# Patient Record
Sex: Male | Born: 1990 | Race: White | Hispanic: No | Marital: Married
Health system: Southern US, Community
[De-identification: ages and names within clinical notes are randomized; demographics above are authoritative.]

## PROBLEM LIST (undated history)

## (undated) DIAGNOSIS — F1910 Other psychoactive substance abuse, uncomplicated: Secondary | ICD-10-CM

---

## 2013-03-12 ENCOUNTER — Ambulatory Visit
Admission: RE | Admit: 2013-03-12 | Discharge: 2013-03-12 | Disposition: A | Discharge status: Home / Self Care | Payer: No Typology Code available for payment source | Source: Ambulatory Visit | Attending: General Practice | Admitting: General Practice

## 2013-03-12 ENCOUNTER — Other Ambulatory Visit: Payer: Self-pay | Admitting: General Practice

## 2013-03-12 DIAGNOSIS — R7611 Nonspecific reaction to tuberculin skin test without active tuberculosis: Secondary | ICD-10-CM

## 2015-03-18 IMAGING — CR DG CHEST 2V
2 series · 2 of 2 positions shown · non-contrast
Comparison: None.

CLINICAL DATA: 21-year-old male with positive PPD.

EXAM:
CHEST  2 VIEW

[view not recorded (1 of 2)]
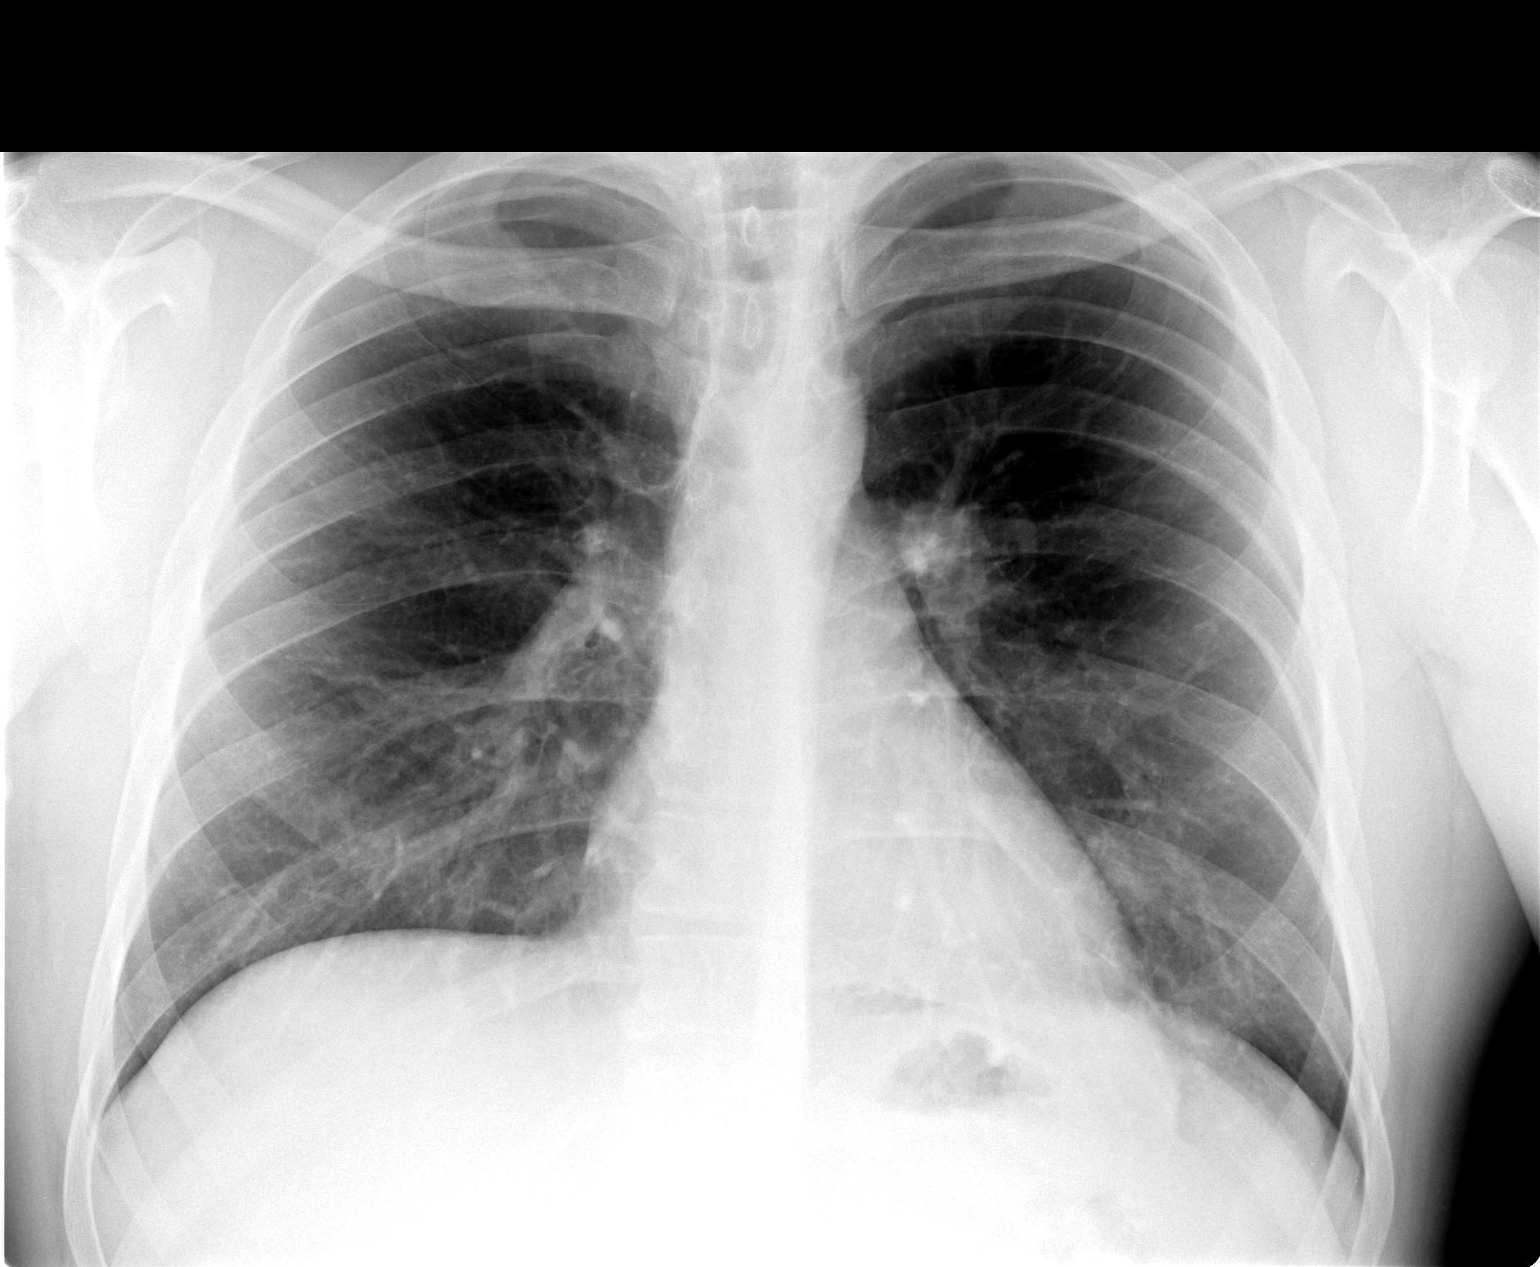

[view not recorded (2 of 2)]
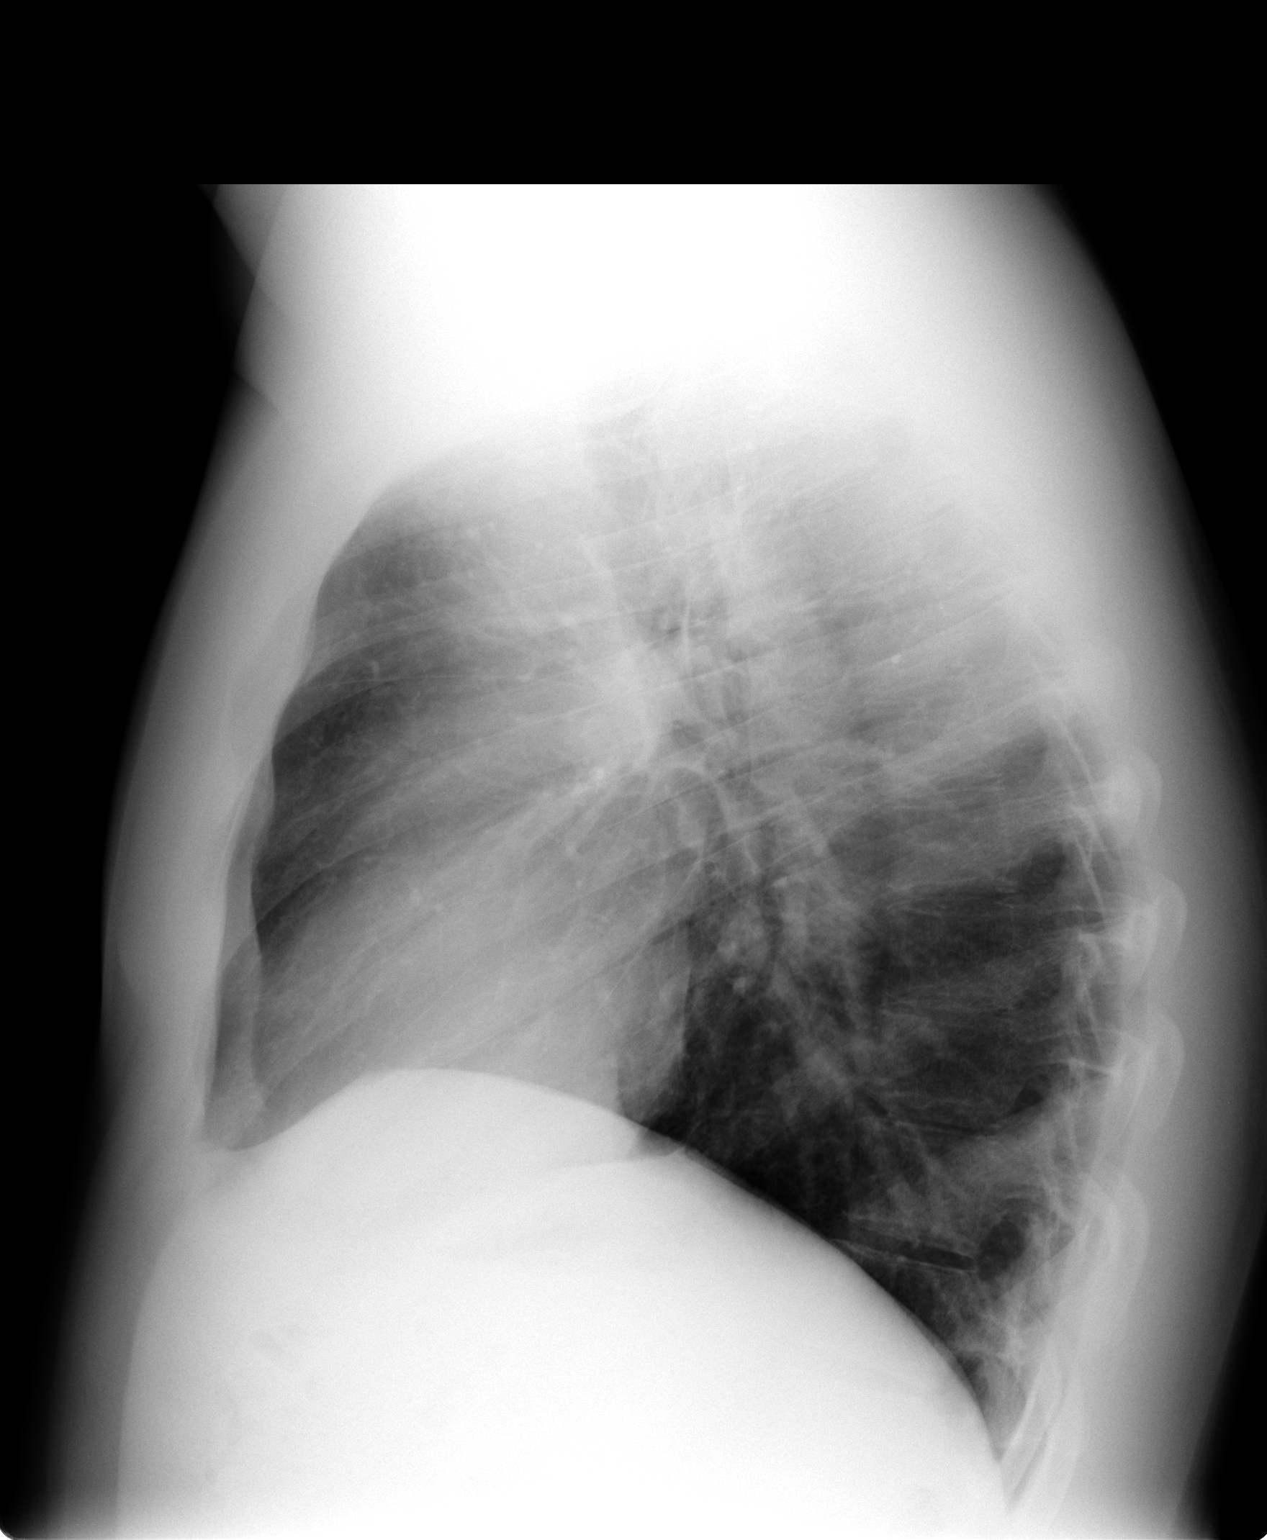

[2 of 2 positions shown; findings below may reference images not displayed]

FINDINGS: Lung volumes are within normal limits. Normal cardiac size and
mediastinal contours. Visualized tracheal air column is within
normal limits. The lungs are clear. No pneumothorax or effusion.
Insert bone 's
IMPRESSION: Negative, no acute cardiopulmonary abnormality.

## 2021-09-20 ENCOUNTER — Other Ambulatory Visit: Payer: Self-pay | Admitting: *Deleted

## 2021-09-20 ENCOUNTER — Ambulatory Visit
Admission: RE | Admit: 2021-09-20 | Discharge: 2021-09-20 | Disposition: A | Discharge status: Home / Self Care | Payer: No Typology Code available for payment source | Source: Ambulatory Visit | Attending: *Deleted | Admitting: *Deleted

## 2021-09-20 DIAGNOSIS — R7611 Nonspecific reaction to tuberculin skin test without active tuberculosis: Secondary | ICD-10-CM

## 2022-08-28 ENCOUNTER — Other Ambulatory Visit: Payer: Self-pay

## 2022-08-28 ENCOUNTER — Emergency Department
Admission: EM | Admit: 2022-08-28 | Discharge: 2022-08-28 | Disposition: A | Discharge status: Home / Self Care | Payer: 59 | Attending: Emergency Medicine | Admitting: Emergency Medicine

## 2022-08-28 DIAGNOSIS — F1910 Other psychoactive substance abuse, uncomplicated: Secondary | ICD-10-CM | POA: Diagnosis not present

## 2022-08-28 HISTORY — DX: Other psychoactive substance abuse, uncomplicated: F19.10

## 2022-08-28 LAB — CBC
HCT: 39 % (ref 39.0–52.0)
Hemoglobin: 13.5 g/dL (ref 13.0–17.0)
MCH: 29.7 pg (ref 26.0–34.0)
MCHC: 34.6 g/dL (ref 30.0–36.0)
MCV: 85.7 fL (ref 80.0–100.0)
Platelets: 255 10*3/uL (ref 150–400)
RBC: 4.55 MIL/uL (ref 4.22–5.81)
RDW: 12.5 % (ref 11.5–15.5)
WBC: 11 10*3/uL — ABNORMAL HIGH (ref 4.0–10.5)
nRBC: 0 % (ref 0.0–0.2)

## 2022-08-28 LAB — ACETAMINOPHEN LEVEL: Acetaminophen (Tylenol), Serum: <10 ug/mL — ABNORMAL LOW (ref 10–30)

## 2022-08-28 LAB — COMPREHENSIVE METABOLIC PANEL
ALT: 18 U/L (ref 0–44)
AST: 31 U/L (ref 15–41)
Albumin: 4.8 g/dL (ref 3.5–5.0)
Alkaline Phosphatase: 48 U/L (ref 38–126)
Anion gap: 15 (ref 5–15)
BUN: 22 mg/dL — ABNORMAL HIGH (ref 6–20)
CO2: 21 mmol/L — ABNORMAL LOW (ref 22–32)
Calcium: 9.8 mg/dL (ref 8.9–10.3)
Chloride: 102 mmol/L (ref 98–111)
Creatinine, Ser: 1.31 mg/dL — ABNORMAL HIGH (ref 0.61–1.24)
GFR, Estimated: >60 mL/min (ref >60)
Glucose, Bld: 95 mg/dL (ref 70–99)
Potassium: 3.4 mmol/L — ABNORMAL LOW (ref 3.5–5.1)
Sodium: 138 mmol/L (ref 135–145)
Total Bilirubin: 1.4 mg/dL — ABNORMAL HIGH (ref 0.3–1.2)
Total Protein: 8.6 g/dL — ABNORMAL HIGH (ref 6.5–8.1)

## 2022-08-28 LAB — URINE DRUG SCREEN, QUALITATIVE (ARMC ONLY)
Amphetamines, Ur Screen: POSITIVE — AB
Barbiturates, Ur Screen: NOT DETECTED
Benzodiazepine, Ur Scrn: NOT DETECTED
Cannabinoid 50 Ng, Ur ~~LOC~~: POSITIVE — AB
Cocaine Metabolite,Ur ~~LOC~~: NOT DETECTED
MDMA (Ecstasy)Ur Screen: NOT DETECTED
Methadone Scn, Ur: NOT DETECTED
Opiate, Ur Screen: NOT DETECTED
Phencyclidine (PCP) Ur S: NOT DETECTED
Tricyclic, Ur Screen: NOT DETECTED

## 2022-08-28 LAB — AMMONIA: Ammonia: 23 umol/L (ref 9–35)

## 2022-08-28 LAB — SALICYLATE LEVEL: Salicylate Lvl: <7 mg/dL — ABNORMAL LOW (ref 7.0–30.0)

## 2022-08-28 LAB — ETHANOL: Alcohol, Ethyl (B): <10 mg/dL (ref <10)

## 2022-08-28 LAB — CK: Total CK: 815 U/L — ABNORMAL HIGH (ref 49–397)

## 2022-08-28 MED ORDER — SODIUM CHLORIDE 0.9 % IV BOLUS
1000.0000 mL | Freq: Once | INTRAVENOUS | Status: AC
  Administered 2022-08-28: 1000 mL via INTRAVENOUS

## 2022-08-28 NOTE — ED Triage Notes (Signed)
Patient admits to eating hallucinogenic mushrooms prior to arrival; Unclear who called EMS, Narcan was administered by PD with no improvement; Patient is only oriented to himself and does appear to be hallucinating during triage

## 2022-08-28 NOTE — ED Provider Notes (Signed)
[facility] Provider Note    Event Date/Time   First MD Initiated Contact with Patient 08/28/22 1504     (approximate)   History   Ingestion (Patient admits to eating hallucinogenic mushrooms prior to arrival; Unclear who called EMS, Narcan was administered by PD with no improvement; Patient is only oriented to himself and does appear to be hallucinating during triage)   HPI  Brent Carrillo is a 32 y.o. male on review of records has a history of previous alcohol intoxication as noted in January of this year  Patient reports "where MI at" and reports the last thing that he recalls is that he used a bag of mushrooms and was drinking alcohol near the beach and [place].  Reports he is [place], but has no idea how he ended up here at the hospital.  Reports At a hospital but which 1?  Denies headache pain or injury.  He reports 2 weeks ago he fell and injured his right hand and went to a hospital in [place].  There has been bruised since but not painful  No chest pain no trouble breathing.  Reports that he ate a bag of mushrooms which she has done before and its about the last thing he remembers.  He reports there were mushrooms "from a field".  No nausea or vomiting.       Physical Exam   Triage Vital Signs: ED Triage Vitals  Enc Vitals Group     BP 08/28/22 1457 132/80     Pulse Rate 08/28/22 1457 (!) 123     Resp 08/28/22 1457 20     Temp 08/28/22 1457 97.8 F (36.6 C)     Temp Source 08/28/22 1457 Oral     SpO2 08/28/22 1457 100 %     Weight 08/28/22 1458 200 lb (90.7 kg)     Height 08/28/22 1458 6' (1.829 m)     Head Circumference --      [place] --      Pain Score 08/28/22 1458 0     Pain Loc --      Pain Edu? --      Excl. in [place]? --     Most recent vital signs: Vitals:   08/28/22 1457 08/28/22 1530  BP: 132/80 (!) 141/91  Pulse: (!) 123 71  Resp: 20 18  Temp: 97.8 F (36.6 C) 97.8 F (36.6 C)   SpO2: 100% 100%     General: Awake, no distress.  He is a bit confused has to be oriented to where he is but after I told him he is in [facility] he seems to continue to recognize this.  Denies that he was trying to harm himself.  Reports he uses mushrooms recreationally.  No suicidal ideations denies any other coingestants other than alcohol CV:  Good peripheral perfusion.  Tachycardia.  No murmurs Resp:  Normal effort.  Clear bilateral.  Normal work of breathing. Abd:  No distention.  Soft nontender nondistended Other:  Is removed his own IV, he does not recall doing so.  Placed pressure with gauze on the site and bleeding stopped within 30 seconds.  He moves all extremities well without deficit.  Follows commands.  He is oriented to his name and believes the month is March, but he is not oriented to the situation is how he got here.  Pupils equal round reactive to light and accommodation.  No nystagmus.  No active nausea or vomiting.   ED Results / Procedures / Treatments   Labs (all labs ordered are listed, but only abnormal results are displayed) Labs Reviewed  CBC - Abnormal; Notable for the following components:      Result Value   WBC 11.0 (*)    All other components within normal limits  COMPREHENSIVE METABOLIC PANEL - Abnormal; Notable for the following components:   Potassium 3.4 (*)    CO2 21 (*)    BUN 22 (*)    Creatinine, Ser 1.31 (*)    Total Protein 8.6 (*)    Total Bilirubin 1.4 (*)    All other components within normal limits  URINE DRUG SCREEN, QUALITATIVE ([facility] ONLY) - Abnormal; Notable for the following components:   Amphetamines, Ur Screen POSITIVE (*)    Cannabinoid 50 Ng, Ur [place] POSITIVE (*)    All other components within normal limits  SALICYLATE LEVEL - Abnormal; Notable for the following components:   Salicylate Lvl [id number] (*)    All other components within normal limits  ACETAMINOPHEN LEVEL - Abnormal; Notable for the following  components:   Acetaminophen (Tylenol), Serum <10 (*)    All other components within normal limits  CK - Abnormal; Notable for the following components:   Total CK 815 (*)    All other components within normal limits  AMMONIA  ETHANOL     EKG  Interpreted me at 1555 heart rate 120 QRS 90 QTc 460 Sinus tachycardia no evidence of acute ischemia   RADIOLOGY  His alertness and orientation seem to be steadily improving.  There is no evidence of head injury no neurologic deficits noted at this time.  Doubt acute intracranial hemorrhage, he gives a clinical history supportive of use of hallucinogenic mushroom       Patient normocephalic atraumatic.  No evidence of injuries except for a what appears to be remote bruise over his right hand it demonstrates normal range of motion there is no deformity and reports about he believes 2 weeks ago he had it evaluated while in [place]:  Critical Care performed: No  Procedures   MEDICATIONS ORDERED IN ED: Medications  sodium chloride 0.9 % bolus 1,000 mL (0 mLs Intravenous Stopped 08/28/22 1749)     IMPRESSION / MDM / [place] / ED COURSE  I reviewed the triage vital signs and the nursing notes.                              Differential diagnosis includes, but is not limited to, ingestion of hallucinogen, overdose, coingestants, polysubstance abuse, or other acute metabolic or toxic abnormality.  No evidence of trauma or injury.  Denies any associated pain or discomfort.  He is now alert to self and somewhat to month, but still seems a little bit slow or slightly confused and his cognition though this seems to be steadily improving.  Provide clinical history of mushroom use.  Does have a history of known alcohol abuse in the past as well.  Will check labs including CBC metabolic panel ammonia CK, and provide hydration as his mucous membranes appear quite dry at this time.  He is tachycardic but otherwise  hemodynamics are normal  Patient's presentation is most consistent with acute complicated illness / injury requiring diagnostic workup.   The patient is on the cardiac monitor to evaluate for evidence of arrhythmia and/or significant heart rate changes.  Clinical  Course as of 08/28/22 1921  Mon Aug 28, 2022  1736 Patient resting comfortably, compliant with care request.  His vital signs including heart rate have normalized.  He now reports that he recalls that he has been using methamphetamine for about the last 4 to 5 days and as he came off of it to help ease that he decided to use psychedelic mushrooms.  He knows that he is in [facility] his mental status continues to improve.  The present time resting. [MQ]  1737 CK Total(!): 815 Mild elevation.  Has received IV fluids and hydration. [MQ]  [id number] Labs interpreted as minimal leukocytosis, slight bump in creatinine mild hypokalemia [MQ]  1737 .  Suspect elevated creatinine likely related to element of mild dehydration.  No clinical signs or symptoms to suggest severe rhabdomyolysis.  No myositis. [MQ]  1910 Patient drug screen positive for amphetamines and cannabinoid.  Patient self reports that he now recalls using methamphetamine for the last several days and taking shrooms as a "calm down" today. [MQ]    Clinical Course User Index [MQ] Delman Kitten, MD   ----------------------------------------- 7:21 PM on 08/28/2022 ----------------------------------------- Patient alert oriented.  Recognizes that he lives in [place] knows his address.  He is now fully oriented ambulatory without distress.  He advises that he has previously been and still engaged with [facility] but had a "relapse" with methamphetamine use.  He reports he has a history of substance abuse has been through [employer]" in the past and has an outpatient psychiatrist.  He plans to engage his psychiatrist and follow-up with [facility] and also discussed and recommended he follow-up with [facility].  He is now clinically alert well-oriented without distress ambulating without difficulty well-oriented and appropriate for discharge.  Return precautions and treatment recommendations and follow-up discussed with the patient who is agreeable with the plan.   FINAL CLINICAL IMPRESSION(S) / ED DIAGNOSES   Final diagnoses:  Polysubstance abuse ([place])     Rx / DC Orders   ED Discharge Orders     None        Note:  This document was prepared using Dragon voice recognition software and may include unintentional dictation errors.   Delman Kitten, MD 08/28/22 [place]

## 2022-08-28 NOTE — Discharge Instructions (Signed)
No driving today or while in the influence of illegal substances/drugs  Strongly encourage you to avoid future use of illicit substances such as methamphetamine, hallucinogenic mushrooms, etc.  Return to the ER right away if you develop confusion, feel paranoid, develop desire to harm yourself or others, concern you may have overdosed, or other concerns arise

## 2023-09-26 IMAGING — DX DG CHEST 2V
2 series · 2 of 2 positions shown · non-contrast
Comparison: 03/12/2013

CLINICAL DATA: 30-year-old male with a history of positive PPD

EXAM:
CHEST - 2 VIEW

[dg chest 2 view (1 of 2)]
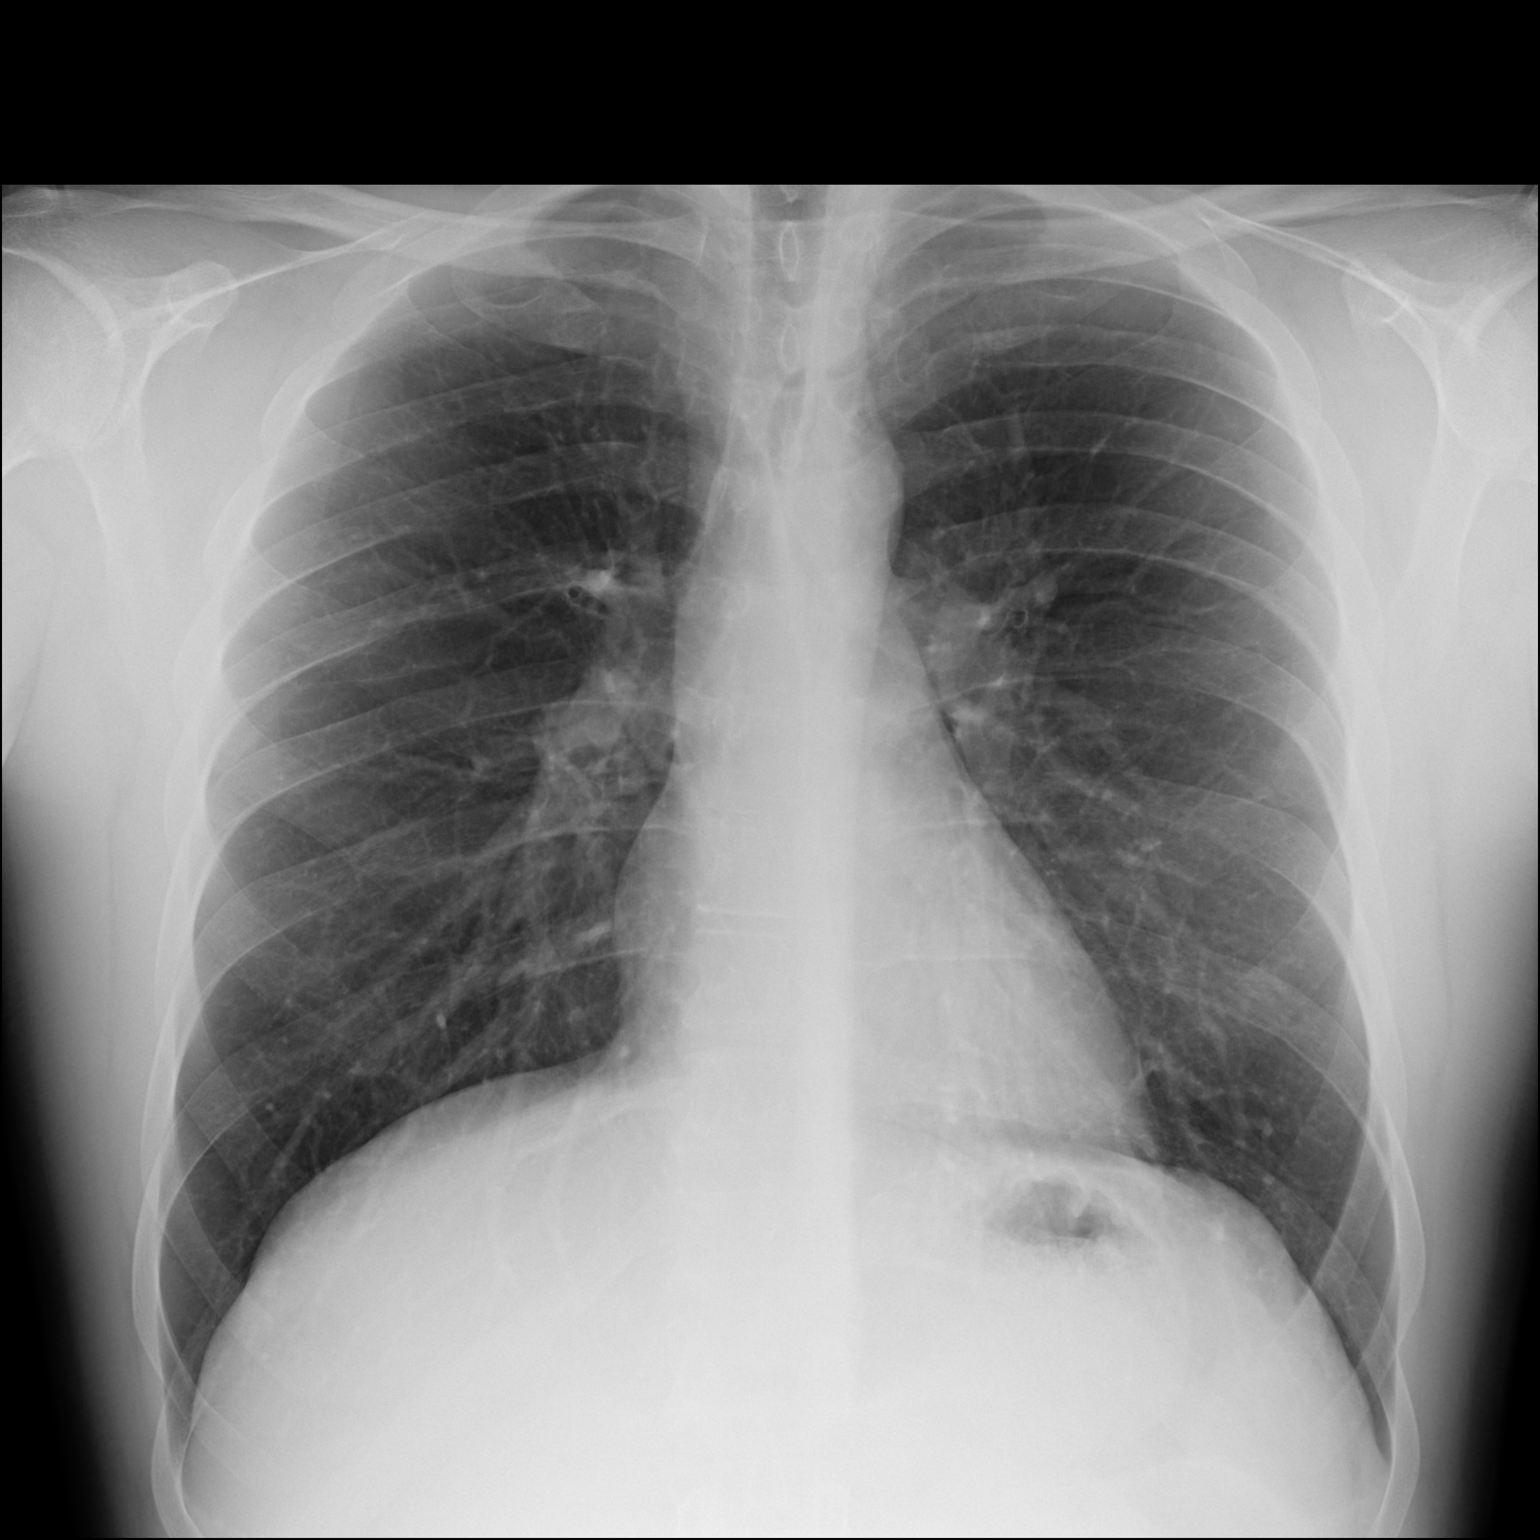

[dg chest 2 view (2 of 2)]
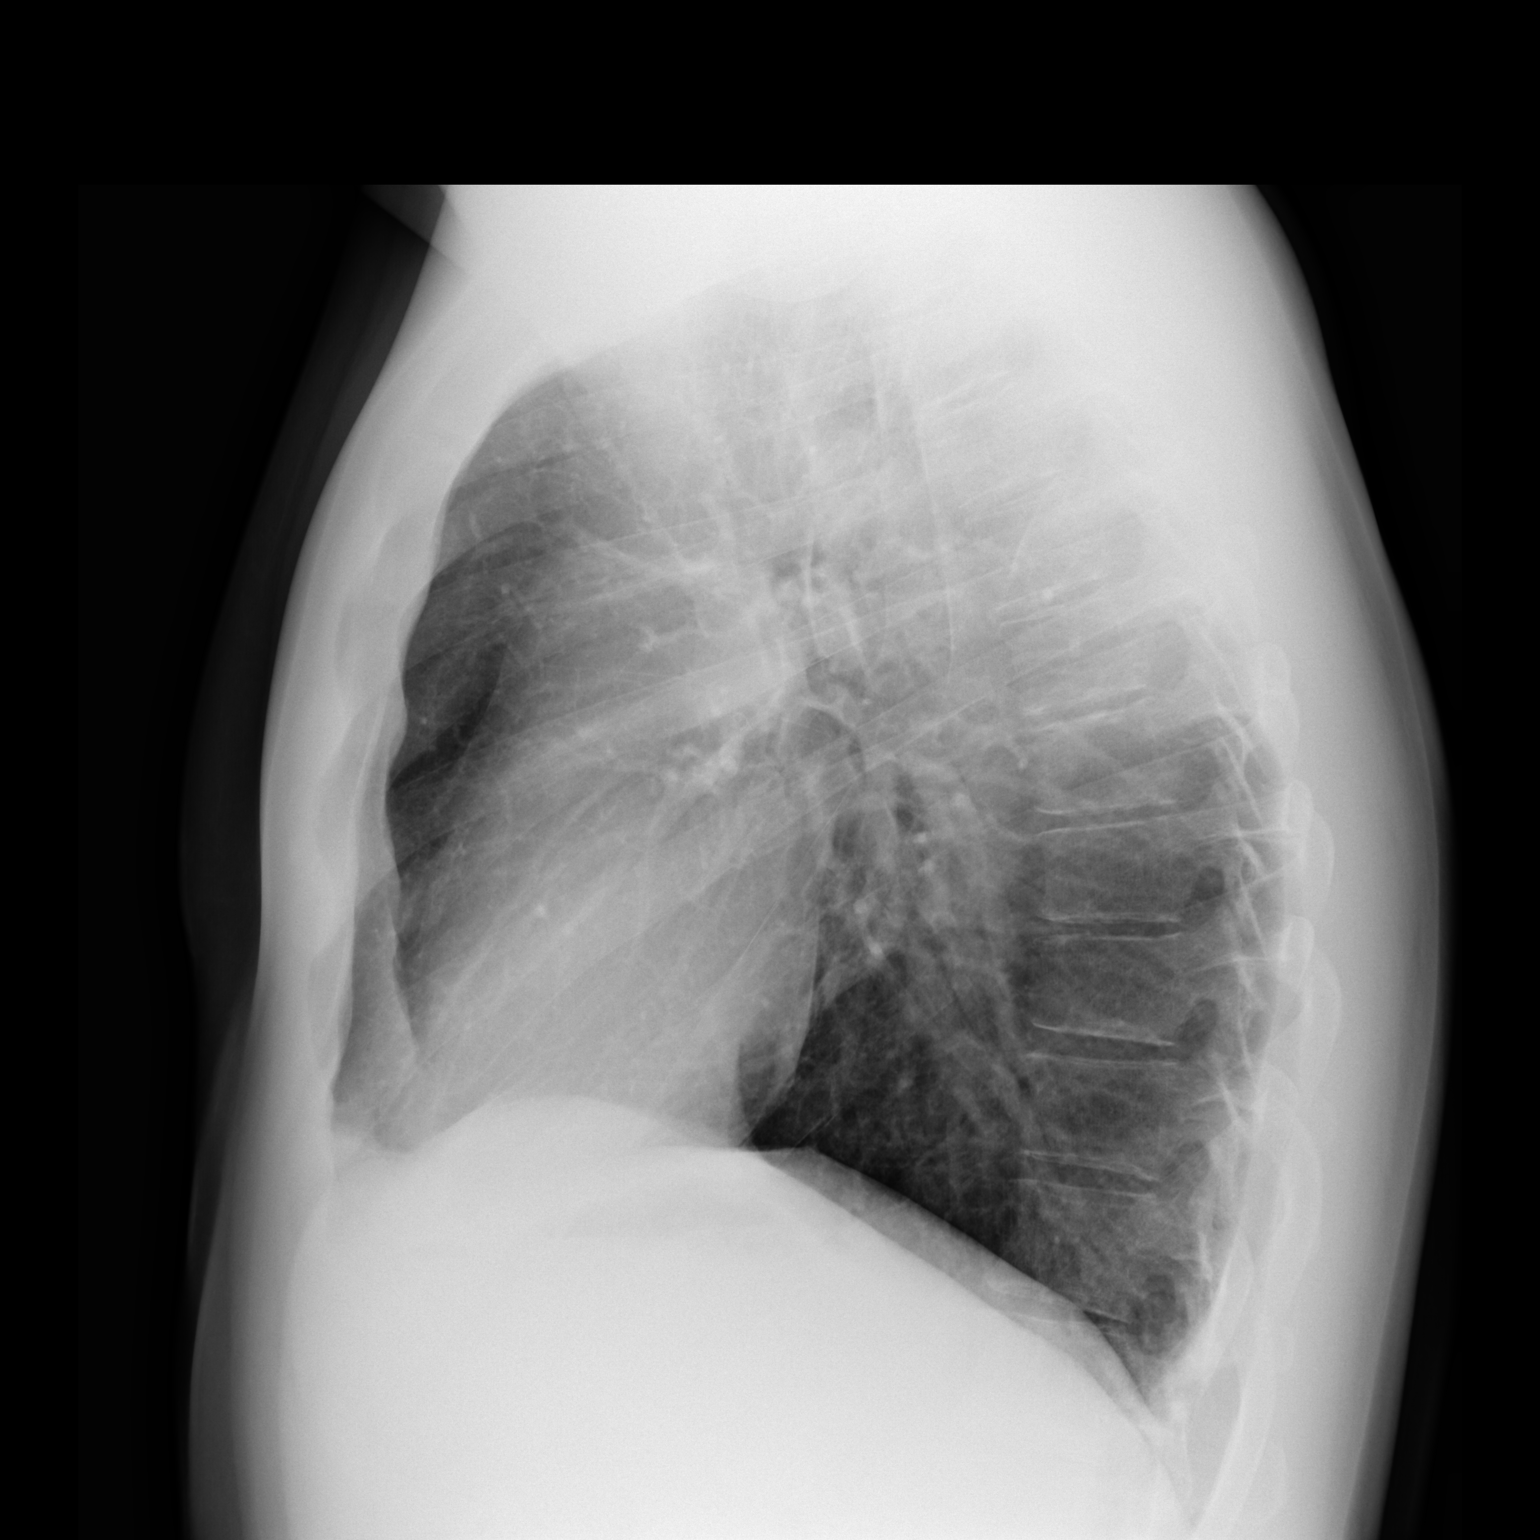

[2 of 2 positions shown; findings below may reference images not displayed]

FINDINGS: Cardiomediastinal silhouette unchanged in size and contour. No
evidence of central vascular congestion. No interlobular septal
thickening. No pneumothorax or pleural effusion. No confluent
airspace disease.

No acute displaced fracture
IMPRESSION: No active cardiopulmonary disease.
